# Patient Record
Sex: Male | Born: 1995 | Hispanic: Yes | Marital: Single | Smoking: Current every day smoker
Health system: Western US, Academic
[De-identification: ages and names within clinical notes are randomized; demographics above are authoritative.]

## PROBLEM LIST (undated history)

## (undated) DIAGNOSIS — F419 Anxiety disorder, unspecified: Secondary | ICD-10-CM

## (undated) HISTORY — PX: HERNIA REPAIR: SHX51

---

## 2017-03-07 ENCOUNTER — Encounter (HOSPITAL_COMMUNITY): Payer: Self-pay

## 2017-03-07 DIAGNOSIS — S8011XA Contusion of right lower leg, initial encounter: Secondary | ICD-10-CM | POA: Insufficient documentation

## 2017-03-07 DIAGNOSIS — F17290 Nicotine dependence, other tobacco product, uncomplicated: Secondary | ICD-10-CM | POA: Insufficient documentation

## 2017-03-07 DIAGNOSIS — Y929 Unspecified place or not applicable: Secondary | ICD-10-CM | POA: Insufficient documentation

## 2017-03-07 DIAGNOSIS — Z23 Encounter for immunization: Secondary | ICD-10-CM | POA: Insufficient documentation

## 2017-03-07 DIAGNOSIS — W208XXA Other cause of strike by thrown, projected or falling object, initial encounter: Secondary | ICD-10-CM | POA: Insufficient documentation

## 2017-03-07 DIAGNOSIS — S80811A Abrasion, right lower leg, initial encounter: Secondary | ICD-10-CM | POA: Insufficient documentation

## 2017-03-07 DIAGNOSIS — Y939 Activity, unspecified: Secondary | ICD-10-CM | POA: Insufficient documentation

## 2017-03-07 DIAGNOSIS — Y998 Other external cause status: Secondary | ICD-10-CM | POA: Insufficient documentation

## 2017-03-07 NOTE — ED Triage Notes (Signed)
Onset 2 days ago pt was at work and steel poles fell on pts right leg.  Scrape down shin, swelling, and bruising to anterior and posterior lower leg.

## 2017-03-08 ENCOUNTER — Emergency Department (HOSPITAL_COMMUNITY): Payer: Self-pay

## 2017-03-08 ENCOUNTER — Emergency Department (HOSPITAL_COMMUNITY)
Admission: EM | Admit: 2017-03-08 | Discharge: 2017-03-08 | Disposition: A | Discharge status: Home / Self Care | Payer: Self-pay | Attending: Emergency Medicine | Admitting: Emergency Medicine

## 2017-03-08 DIAGNOSIS — S8011XA Contusion of right lower leg, initial encounter: Secondary | ICD-10-CM

## 2017-03-08 DIAGNOSIS — T148XXA Other injury of unspecified body region, initial encounter: Secondary | ICD-10-CM

## 2017-03-08 MED ORDER — TETANUS-DIPHTH-ACELL PERTUSSIS 5-2.5-18.5 LF-MCG/0.5 IM SUSP
0.5000 mL | Freq: Once | INTRAMUSCULAR | Status: AC
  Administered 2017-03-08: 0.5 mL via INTRAMUSCULAR
  Filled 2017-03-08: qty 0.5

## 2017-03-08 MED ORDER — TRAMADOL HCL 50 MG PO TABS
50.0000 mg | ORAL_TABLET | Freq: Once | ORAL | Status: AC
  Administered 2017-03-08: 50 mg via ORAL
  Filled 2017-03-08: qty 1

## 2017-03-08 MED ORDER — TRAMADOL HCL 50 MG PO TABS: 50.0000 mg | ORAL_TABLET | Freq: Four times a day (QID) | ORAL | 0 refills | Status: AC | PRN

## 2017-03-08 NOTE — Discharge Instructions (Signed)
Apply ice several times a day. Take ibuprofen, naproxen or acetaminophen as needed for less severe pain. It will take several months for your swelling to go away completely.

## 2017-03-08 NOTE — ED Provider Notes (Signed)
[facility]-EMERGENCY DEPT Provider Note   CSN: [id number] Arrival date & time: 03/07/17  2202     History   Chief Complaint Chief Complaint  Patient presents with  . Abrasion  . Leg Injury    HPI Jim Yates is a 21 y.o. male.  The history is provided by the patient.  2 days ago, a steel beam fell on his right lower leg. He did apply ice at that point. He continues to complain of pain in that leg and rates pain at 6/10. He has pain with ambulation and weightbearing. He has developed a bruise on his leg, and also has developed some discoloration down into his foot. He denies other injury. Also, he did suffer an abrasion to the leg. He states he has never received a tetanus immunization.  History reviewed. No pertinent past medical history.  There are no active problems to display for this patient.   History reviewed. No pertinent surgical history.     Home Medications    Prior to Admission medications   Not on File    Family History History reviewed. No pertinent family history.  Social History Social History  Substance Use Topics  . Smoking status: Current Every Day Smoker    Types: E-cigarettes  . Smokeless tobacco: Never Used  . Alcohol use No     Allergies   Patient has no known allergies.   Review of Systems Review of Systems  All other systems reviewed and are negative.    Physical Exam Updated Vital Signs BP (!) 152/93 (BP Location: Right Arm)   Pulse 99   Temp 98 F (36.7 C) (Oral)   Resp 20   Ht  (1.905 m)   Wt 113.4 kg (250 lb)   SpO2 100%   BMI 31.25 kg/m   Physical Exam  Nursing note and vitals reviewed.  21 year old male, resting comfortably and in no acute distress. Vital signs are significant for hypertension. Oxygen saturation is 100%, which is normal. Head is normocephalic and atraumatic. PERRLA, EOMI. Oropharynx is clear. Neck is nontender and supple without adenopathy or JVD. Back is nontender and there is no  CVA tenderness. Lungs are clear without rales, wheezes, or rhonchi. Chest is nontender. Heart has regular rate and rhythm without murmur. Abdomen is soft, flat, nontender without masses or hepatosplenomegaly and peristalsis is normoactive. Extremities: Ecchymosis is present over the medial and posterior aspects of the right calf with moderate swelling of the right calf compared with the left. There is diffuse tenderness of the right lower leg without point tenderness. There is also a linear abrasion over the right lower leg. Some dependent ecchymosis is noted on the right heel medially. Dorsalis pedis pulse is strong. There is normal sensation and prompt capillary refill. Skin is warm and dry without rash. Neurologic: Mental status is normal, cranial nerves are intact, there are no motor or sensory deficits.  ED Treatments / Results   Radiology Dg Tibia/fibula Right  Result Date: 03/08/2017 CLINICAL DATA:  Trauma. Heavy object fell on the patient's left leg. EXAM: RIGHT TIBIA AND FIBULA - 2 VIEW COMPARISON:  None. FINDINGS: There is no evidence of fracture or other focal bone lesions. Soft tissues are unremarkable. IMPRESSION: No acute osseous abnormality of the left tibia or fibula. Electronically Signed   By: Deatra Robinson M.D.   On: 03/08/2017 04:12    Procedures Procedures (including critical care time)  Medications Ordered in ED Medications  Tdap (BOOSTRIX) injection 0.5 mL (not administered)  traMADol (ULTRAM) tablet 50 mg (not administered)     Initial Impression / Assessment and Plan / ED Course  I have reviewed the triage vital signs and the nursing notes.  Pertinent imaging results that were available during my care of the patient were reviewed by me and considered in my medical decision making (see chart for details).  Blunt trauma to right lower leg. Will send for x-rays to rule out fracture. Tdap booster given..  X-rays are negative for fracture. He is given crutches  and prescription for, and all. Advised to use over-the-counter analgesics as his preferred pain medication. Advised that this will take several months to fully heal. Referred to orthopedics for any problems that arise.  Final Clinical Impressions(s) / ED Diagnoses   Final diagnoses:  Contusion of right lower extremity, initial encounter  Abrasion    New Prescriptions New Prescriptions   TRAMADOL (ULTRAM) 50 MG TABLET    Take 1 tablet (50 mg total) by mouth every 6 (six) hours as needed.     Dione Booze, MD 03/08/17 [phone]

## 2017-03-08 NOTE — Progress Notes (Signed)
Orthopedic Tech Progress Note Patient Details:  Jim Yates 10/25/1995 [id number]  Ortho Devices Type of Ortho Device: Crutches Ortho Device/Splint Interventions: Ordered, Application, Adjustment   Trinna Post 03/08/2017, 5:48 AM

## 2018-05-30 ENCOUNTER — Ambulatory Visit: Payer: Self-pay | Admitting: Family Medicine

## 2018-06-02 ENCOUNTER — Encounter (HOSPITAL_COMMUNITY): Payer: Self-pay | Admitting: Emergency Medicine

## 2018-06-02 ENCOUNTER — Other Ambulatory Visit: Payer: Self-pay

## 2018-06-02 ENCOUNTER — Emergency Department (HOSPITAL_COMMUNITY)
Admission: EM | Admit: 2018-06-02 | Discharge: 2018-06-02 | Disposition: A | Discharge status: Home / Self Care | Payer: Self-pay | Attending: Emergency Medicine | Admitting: Emergency Medicine

## 2018-06-02 ENCOUNTER — Emergency Department (HOSPITAL_COMMUNITY): Payer: Self-pay

## 2018-06-02 DIAGNOSIS — R109 Unspecified abdominal pain: Secondary | ICD-10-CM

## 2018-06-02 DIAGNOSIS — F17210 Nicotine dependence, cigarettes, uncomplicated: Secondary | ICD-10-CM | POA: Insufficient documentation

## 2018-06-02 DIAGNOSIS — R1084 Generalized abdominal pain: Secondary | ICD-10-CM | POA: Insufficient documentation

## 2018-06-02 LAB — URINALYSIS, MICROSCOPIC (REFLEX)

## 2018-06-02 LAB — CBC WITH DIFFERENTIAL/PLATELET
ABS IMMATURE GRANULOCYTES: 0.03 10*3/uL (ref 0.00–0.07)
BASOS ABS: 0.1 10*3/uL (ref 0.0–0.1)
Basophils Relative: 1 %
EOS PCT: 4 %
Eosinophils Absolute: 0.4 10*3/uL (ref 0.0–0.5)
HCT: 44.2 % (ref 39.0–52.0)
HEMOGLOBIN: 14.7 g/dL (ref 13.0–17.0)
Immature Granulocytes: 0 %
LYMPHS PCT: 25 %
Lymphs Abs: 2.2 10*3/uL (ref 0.7–4.0)
MCH: 30.1 pg (ref 26.0–34.0)
MCHC: 33.3 g/dL (ref 30.0–36.0)
MCV: 90.4 fL (ref 80.0–100.0)
Monocytes Absolute: 0.6 10*3/uL (ref 0.1–1.0)
Monocytes Relative: 7 %
NEUTROS ABS: 5.8 10*3/uL (ref 1.7–7.7)
NRBC: 0 % (ref 0.0–0.2)
Neutrophils Relative %: 63 %
PLATELETS: 306 10*3/uL (ref 150–400)
RBC: 4.89 MIL/uL (ref 4.22–5.81)
RDW: 12 % (ref 11.5–15.5)
WBC: 9 10*3/uL (ref 4.0–10.5)

## 2018-06-02 LAB — URINALYSIS, ROUTINE W REFLEX MICROSCOPIC
BILIRUBIN URINE: NEGATIVE
Glucose, UA: NEGATIVE mg/dL
Ketones, ur: NEGATIVE mg/dL
Leukocytes, UA: NEGATIVE
NITRITE: NEGATIVE
PROTEIN: NEGATIVE mg/dL
SPECIFIC GRAVITY, URINE: 1.015 (ref 1.005–1.030)
pH: 6.5 (ref 5.0–8.0)

## 2018-06-02 LAB — COMPREHENSIVE METABOLIC PANEL
ALT: 35 U/L (ref 0–44)
ANION GAP: 10 (ref 5–15)
AST: 27 U/L (ref 15–41)
Albumin: 4 g/dL (ref 3.5–5.0)
Alkaline Phosphatase: 60 U/L (ref 38–126)
BUN: 10 mg/dL (ref 6–20)
CHLORIDE: 101 mmol/L (ref 98–111)
CO2: 28 mmol/L (ref 22–32)
Calcium: 9.1 mg/dL (ref 8.9–10.3)
Creatinine, Ser: 0.82 mg/dL (ref 0.61–1.24)
GFR calc non Af Amer: >60 mL/min (ref >60)
Glucose, Bld: 136 mg/dL — ABNORMAL HIGH (ref 70–99)
Potassium: 3.8 mmol/L (ref 3.5–5.1)
SODIUM: 139 mmol/L (ref 135–145)
Total Bilirubin: 0.6 mg/dL (ref 0.3–1.2)
Total Protein: 7.2 g/dL (ref 6.5–8.1)

## 2018-06-02 LAB — LIPASE, BLOOD: Lipase: 40 U/L (ref 11–51)

## 2018-06-02 MED ORDER — ONDANSETRON 8 MG PO TBDP: 8.0000 mg | ORAL_TABLET | Freq: Three times a day (TID) | ORAL | 0 refills | Status: AC | PRN

## 2018-06-02 MED ORDER — HYDROMORPHONE HCL 1 MG/ML IJ SOLN
0.5000 mg | INTRAMUSCULAR | Status: DC | PRN
  Administered 2018-06-02: 0.5 mg via INTRAVENOUS
  Filled 2018-06-02: qty 1

## 2018-06-02 MED ORDER — SODIUM CHLORIDE 0.9 % IV SOLN: INTRAVENOUS | Status: DC

## 2018-06-02 MED ORDER — SODIUM CHLORIDE 0.9 % IV BOLUS
1000.0000 mL | Freq: Once | INTRAVENOUS | Status: AC
  Administered 2018-06-02: 1000 mL via INTRAVENOUS

## 2018-06-02 MED ORDER — IOHEXOL 300 MG/ML  SOLN
100.0000 mL | Freq: Once | INTRAMUSCULAR | Status: AC | PRN
  Administered 2018-06-02: 100 mL via INTRAVENOUS

## 2018-06-02 MED ORDER — FAMOTIDINE 20 MG PO TABS: 20.0000 mg | ORAL_TABLET | Freq: Two times a day (BID) | ORAL | 0 refills | Status: AC

## 2018-06-02 MED ORDER — ONDANSETRON HCL 4 MG/2ML IJ SOLN
4.0000 mg | Freq: Once | INTRAMUSCULAR | Status: AC
  Administered 2018-06-02: 4 mg via INTRAVENOUS
  Filled 2018-06-02: qty 2

## 2018-06-02 NOTE — ED Triage Notes (Signed)
Pt reports mid abdominal pain due to hx of hernia x427months. Reports that pain was worse today making it difficult for him to breathe.  Reports constant nausea

## 2018-06-02 NOTE — Discharge Instructions (Signed)
Take the medications as needed, follow-up with a primary care doctor

## 2018-06-02 NOTE — ED Notes (Signed)
Attempted IV twice, was unsuccessful

## 2018-06-02 NOTE — ED Notes (Signed)
Pt given urinal. Made aware of need for UA. 

## 2018-06-02 NOTE — ED Notes (Signed)
Patient verbalizes understanding of discharge instructions. Opportunity for questioning and answers were provided. Armband removed by staff, pt discharged from ED ambulatory w/ SO 

## 2018-06-02 NOTE — ED Notes (Signed)
Urine culture sent with UA 

## 2018-06-02 NOTE — ED Provider Notes (Signed)
[facility] Provider Note   CSN: [id number] Arrival date & time: 06/02/18  1336     History   Chief Complaint Chief Complaint  Patient presents with  . Hernia  . Abdominal Pain    HPI Jim Yates is a 22 y.o. male.  HPI Patient presented to the emergency room for evaluation of abdominal pain.  Patient states he has had pain around his bellybutton and is noticed some bloody drainage at times for several months.  Today however he started having increasing pain in his upper abdomen.  He was also having pain on the left side chest making it hard for him to breathe.  Patient states the pain is sharp in his abdomen.  It hurts to palpate.  He has had nausea but no vomiting.  No diarrhea.  No dysuria History reviewed. No pertinent past medical history.  There are no active problems to display for this patient.   Past Surgical History:  Procedure Laterality Date  . HERNIA REPAIR          Home Medications    Prior to Admission medications   Medication Sig Start Date End Date Taking? Authorizing Provider  famotidine (PEPCID) 20 MG tablet Take 1 tablet (20 mg total) by mouth 2 (two) times daily. 06/02/18   Linwood DibblesKnapp, Halcyon Heck, MD  ondansetron (ZOFRAN ODT) 8 MG disintegrating tablet Take 1 tablet (8 mg total) by mouth every 8 (eight) hours as needed for nausea or vomiting. 06/02/18   Linwood DibblesKnapp, Esperansa Sarabia, MD  traMADol (ULTRAM) 50 MG tablet Take 1 tablet (50 mg total) by mouth every 6 (six) hours as needed. 03/08/17   Dione BoozeGlick, David, MD    Family History No family history on file.  Social History Social History   Tobacco Use  . Smoking status: Current Every Day Smoker    Types: E-cigarettes  . Smokeless tobacco: Never Used  Substance Use Topics  . Alcohol use: Yes  . Drug use: No     Allergies   Patient has no known allergies.   Review of Systems Review of Systems  All other systems reviewed and are negative.    Physical Exam Updated  Vital Signs BP 130/71   Pulse 93   Temp 98.7 F (37.1 C) (Oral)   Resp 20   Ht 1.93 m (6\' 4" )   Wt 122.5 kg   SpO2 98%   BMI 32.87 kg/m   Physical Exam Vitals signs and nursing note reviewed.  Constitutional:      Appearance: He is obese. He is ill-appearing.  HENT:     Head: Normocephalic and atraumatic.     Right Ear: External ear normal.     Left Ear: External ear normal.  Eyes:     General: No scleral icterus.       Right eye: No discharge.        Left eye: No discharge.     Conjunctiva/sclera: Conjunctivae normal.  Neck:     Musculoskeletal: Neck supple.     Trachea: No tracheal deviation.  Cardiovascular:     Rate and Rhythm: Normal rate and regular rhythm.  Pulmonary:     Effort: Pulmonary effort is normal. No respiratory distress.     Breath sounds: Normal breath sounds. No stridor. No wheezing or rales.  Abdominal:     General: Bowel sounds are normal. There is no distension.     Palpations: Abdomen is soft.     Tenderness: There is generalized abdominal tenderness and tenderness  in the left upper quadrant and left lower quadrant. There is no guarding or rebound.     Hernia: No hernia is present.     Comments: Umbilicus is soft, no evidence of hernia  Musculoskeletal:        General: No tenderness.  Skin:    General: Skin is warm and dry.     Findings: No rash.  Neurological:     Mental Status: He is alert.     Cranial Nerves: No cranial nerve deficit (no facial droop, extraocular movements intact, no slurred speech).     Sensory: No sensory deficit.     Motor: No abnormal muscle tone or seizure activity.     Coordination: Coordination normal.      ED Treatments / Results  Labs (all labs ordered are listed, but only abnormal results are displayed) Labs Reviewed  COMPREHENSIVE METABOLIC PANEL - Abnormal; Notable for the following components:      Result Value   Glucose, Bld 136 (*)    All other components within normal limits  URINALYSIS, ROUTINE  W REFLEX MICROSCOPIC - Abnormal; Notable for the following components:   Hgb urine dipstick TRACE (*)    All other components within normal limits  URINALYSIS, MICROSCOPIC (REFLEX) - Abnormal; Notable for the following components:   Bacteria, UA FEW (*)    All other components within normal limits  LIPASE, BLOOD  CBC WITH DIFFERENTIAL/PLATELET    EKG EKG Interpretation  Date/Time:  Thursday June 02 2018 13:55:28 EST Ventricular Rate:  106 PR Interval:  140 QRS Duration: 92 QT Interval:  332 QTC Calculation: 441 R Axis:   9 Text Interpretation:  Sinus tachycardia Otherwise normal ECG No significant change since last tracing Confirmed by Linwood Dibbles [phone]) on 06/02/2018 1:59:56 PM   Radiology Ct Abdomen Pelvis W Contrast  Result Date: 06/02/2018 CLINICAL DATA:  Acute abdominal pain, generalized pain EXAM: CT ABDOMEN AND PELVIS WITH CONTRAST TECHNIQUE: Multidetector CT imaging of the abdomen and pelvis was performed using the standard protocol following bolus administration of intravenous contrast. CONTRAST:  OMNIPAQUE IOHEXOL 300 MG/ML  SOLN COMPARISON:  KUB 06/02/2018 FINDINGS: Lower chest: Negative Hepatobiliary: No focal liver abnormality is seen. No gallstones, gallbladder wall thickening, or biliary dilatation. Pancreas: Negative Spleen: Negative Adrenals/Urinary Tract: Adrenal glands are unremarkable. Kidneys are normal, without renal calculi, focal lesion, or hydronephrosis. Bladder is unremarkable. Stomach/Bowel: Small hiatal hernia. Negative for bowel obstruction. Negative for bowel mass or edema. Normal appendix. Vascular/Lymphatic: No significant vascular findings are present. No enlarged abdominal or pelvic lymph nodes. Reproductive: Normal prostate Other: Negative for inguinal hernia.  No free fluid in the abdomen Musculoskeletal: Negative IMPRESSION: No cause for acute abdominal pain identified.  Normal appendix Small hiatal hernia Electronically Signed   By: Marlan Palau M.D.   On: 06/02/2018 16:16   Dg Abd Acute W/chest  Result Date: 06/02/2018 CLINICAL DATA:  Chest and abdominal pain EXAM: DG ABDOMEN ACUTE W/ 1V CHEST COMPARISON:  None. FINDINGS: PA chest: Lungs are clear. Heart size and pulmonary vascularity are normal. No adenopathy. Supine and upright abdomen: There is moderate stool throughout colon. There is no bowel dilatation or air-fluid level to suggest bowel obstruction. No free air. No abnormal calcifications. IMPRESSION: Moderate stool throughout colon. No evident bowel obstruction or free air. Lungs clear. Electronically Signed   By: Bretta Bang III M.D.   On: 06/02/2018 15:04    Procedures Procedures (including critical care time)  Medications Ordered in ED Medications  sodium chloride  0.9 % bolus 1,000 mL (0 mLs Intravenous Stopped 06/02/18 1735)    And  0.9 %  sodium chloride infusion (has no administration in time range)  HYDROmorphone (DILAUDID) injection 0.5 mg (0.5 mg Intravenous Given 06/02/18 1434)  ondansetron (ZOFRAN) injection 4 mg (4 mg Intravenous Given 06/02/18 1435)  iohexol (OMNIPAQUE) 300 MG/ML solution 100 mL (100 mLs Intravenous Contrast Given 06/02/18 1557)     Initial Impression / Assessment and Plan / ED Course  I have reviewed the triage vital signs and the nursing notes.  Pertinent labs & imaging results that were available during my care of the patient were reviewed by me and considered in my medical decision making (see chart for details).  Clinical Course as of Jun 02 1924  Thu Jun 02, 2018  1528 Xrays reviewed.  No acute finding.   [JK]  1549 Patient still has abdominal tenderness to the touch.  Mild guarding.  CT to evaluate further , he does remain tachycardic   [JK]    Clinical Course User Index [JK] Linwood DibblesKnapp, Helmut Hennon, MD    Patient presented to the emergency room for evaluation of abdominal pain.  ED work-up is reassuring.  Laboratory tests and CT scan did not show any emergent etiology.   Patient does have a hiatal hernia.  It possibly could be having some acid reflux type issues.  I will give him prescription for Pepcid and Zofran.  I recommend follow-up with a primary care doctor.  Final Clinical Impressions(s) / ED Diagnoses   Final diagnoses:  Abdominal pain, unspecified abdominal location    ED Discharge Orders         Ordered    famotidine (PEPCID) 20 MG tablet  2 times daily     06/02/18 1924    ondansetron (ZOFRAN ODT) 8 MG disintegrating tablet  Every 8 hours PRN     06/02/18 1924           Linwood DibblesKnapp, Sivan Quast, MD 06/02/18 1925

## 2020-06-29 DIAGNOSIS — K449 Diaphragmatic hernia without obstruction or gangrene: Secondary | ICD-10-CM

## 2020-06-29 DIAGNOSIS — Z8719 Personal history of other diseases of the digestive system: Secondary | ICD-10-CM

## 2020-06-29 DIAGNOSIS — T1490XA Injury, unspecified, initial encounter: Secondary | ICD-10-CM | POA: Diagnosis present

## 2020-06-29 DIAGNOSIS — S139XXA Sprain of joints and ligaments of unspecified parts of neck, initial encounter: Secondary | ICD-10-CM

## 2020-06-29 DIAGNOSIS — S0990XA Unspecified injury of head, initial encounter: Secondary | ICD-10-CM

## 2020-06-29 HISTORY — DX: Anxiety disorder, unspecified: F41.9

## 2020-08-17 IMAGING — CR DG ABDOMEN ACUTE W/ 1V CHEST
4 series · 4 of 4 positions shown · non-contrast
Comparison: None.

CLINICAL DATA: Chest and abdominal pain

EXAM:
DG ABDOMEN ACUTE W/ 1V CHEST

[chest pa]
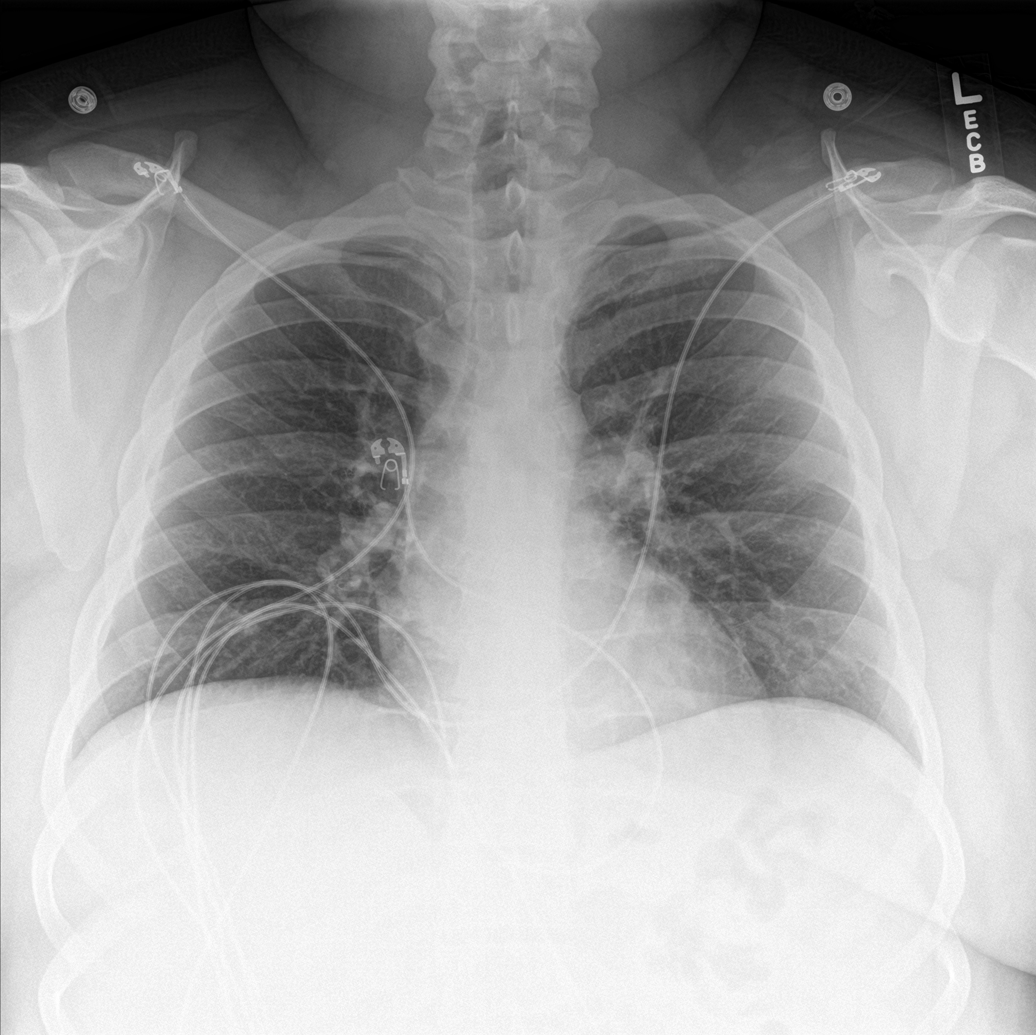

[abdomen erect]
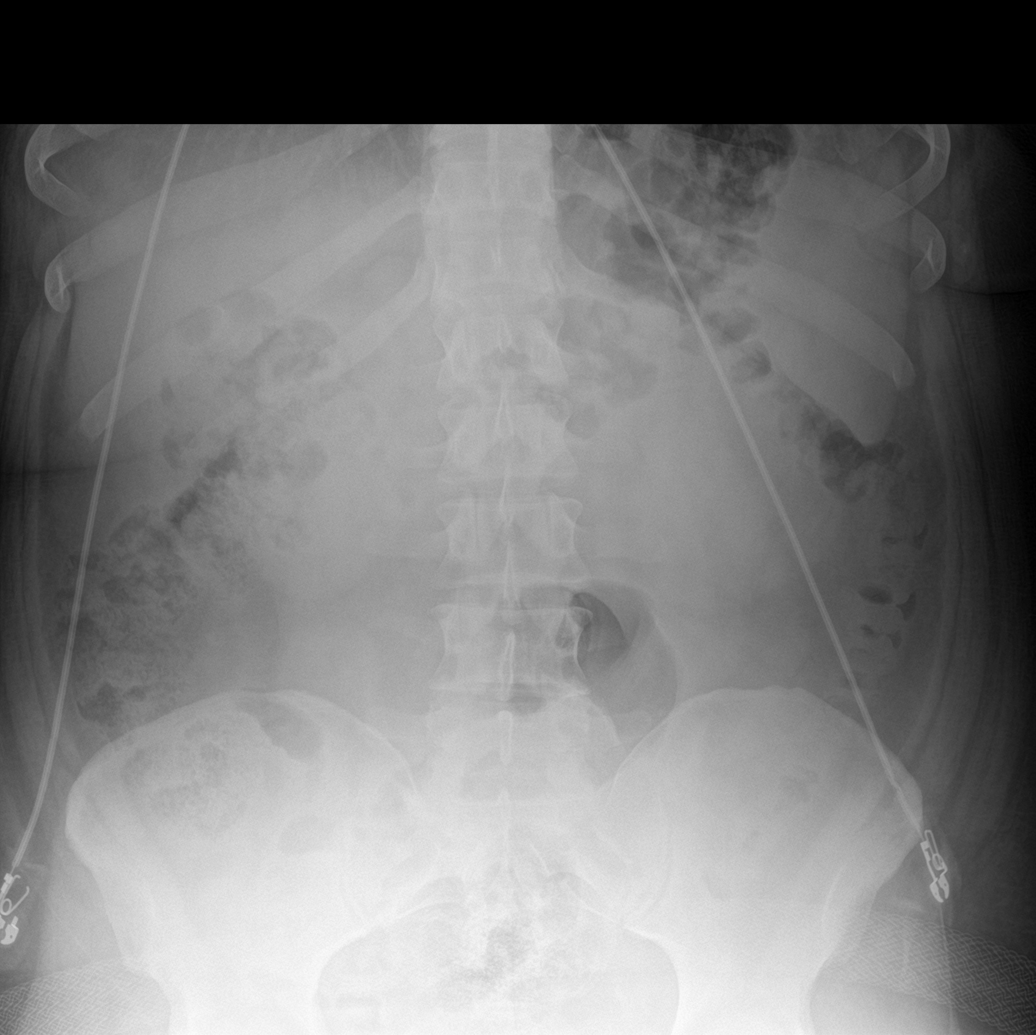

[abdomen supine (1 of 2)]
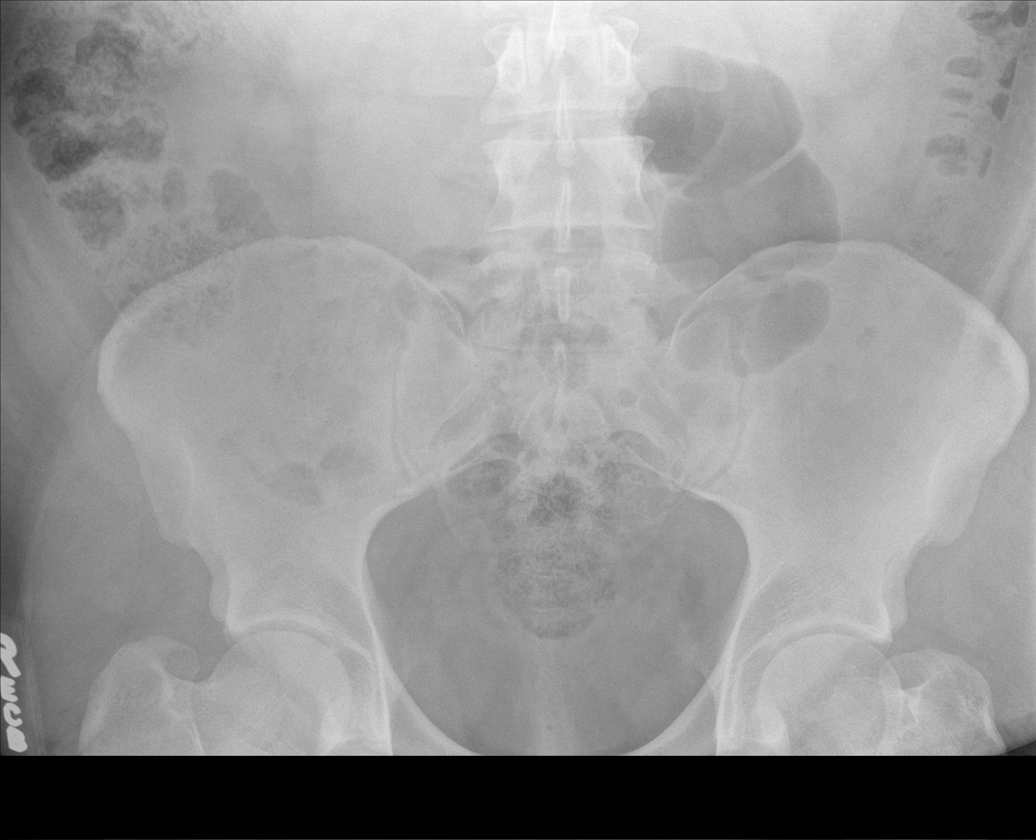

[abdomen supine (2 of 2)]
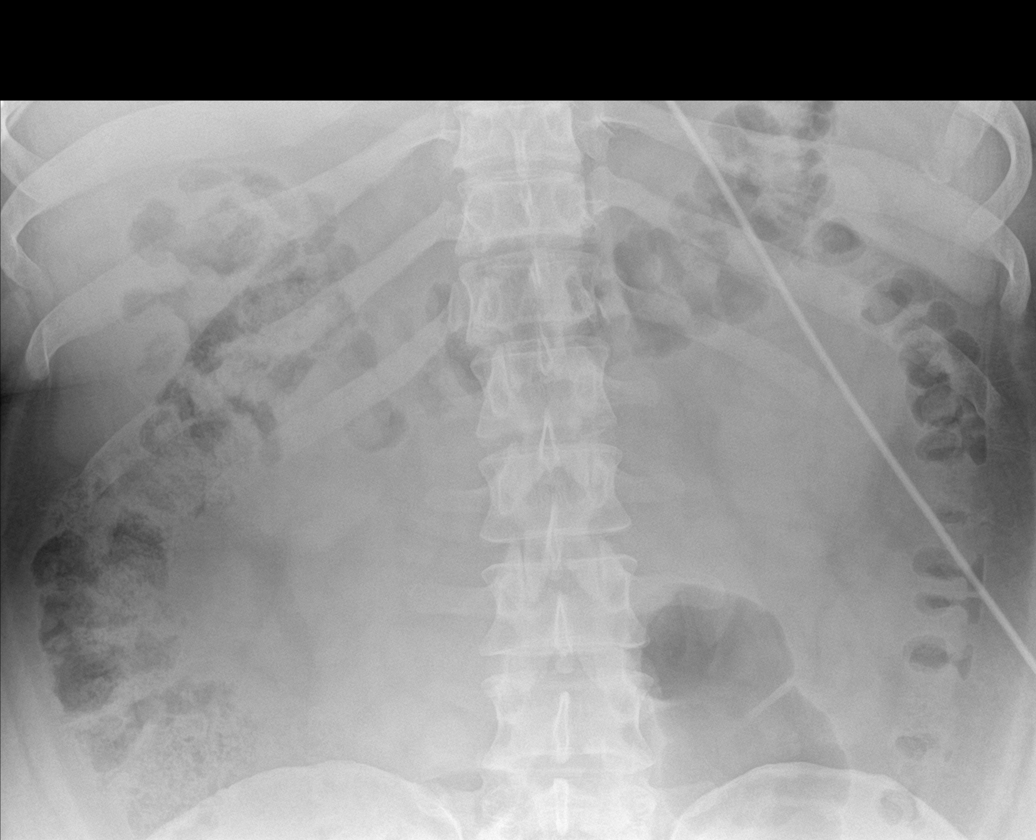

[4 of 4 positions shown; findings below may reference images not displayed]

FINDINGS: PA chest: Lungs are clear. Heart size and pulmonary vascularity are
normal. No adenopathy.

Supine and upright abdomen: There is moderate stool throughout
colon. There is no bowel dilatation or air-fluid level to suggest
bowel obstruction. No free air. No abnormal calcifications.
IMPRESSION: Moderate stool throughout colon. No evident bowel obstruction or
free air. Lungs clear.
